# Patient Record
Sex: Male | Born: 1970 | Race: White | Hispanic: No | Marital: Single | Smoking: Never smoker
Health system: Southern US, Community
[De-identification: ages and names within clinical notes are randomized; demographics above are authoritative.]

## PROBLEM LIST (undated history)

## (undated) DIAGNOSIS — K219 Gastro-esophageal reflux disease without esophagitis: Secondary | ICD-10-CM

## (undated) DIAGNOSIS — I10 Essential (primary) hypertension: Secondary | ICD-10-CM

## (undated) DIAGNOSIS — Z87442 Personal history of urinary calculi: Secondary | ICD-10-CM

---

## 2020-04-09 ENCOUNTER — Other Ambulatory Visit: Payer: Self-pay | Admitting: Orthopedic Surgery

## 2020-04-09 DIAGNOSIS — M5416 Radiculopathy, lumbar region: Secondary | ICD-10-CM

## 2020-04-15 ENCOUNTER — Other Ambulatory Visit: Payer: Self-pay

## 2020-04-15 ENCOUNTER — Ambulatory Visit
Admission: RE | Admit: 2020-04-15 | Discharge: 2020-04-15 | Disposition: A | Discharge status: Home / Self Care | Payer: BC Managed Care – PPO | Source: Ambulatory Visit | Attending: Orthopedic Surgery | Admitting: Orthopedic Surgery

## 2020-04-15 DIAGNOSIS — M5416 Radiculopathy, lumbar region: Secondary | ICD-10-CM | POA: Insufficient documentation

## 2020-05-23 ENCOUNTER — Other Ambulatory Visit: Payer: Self-pay | Admitting: Neurosurgery

## 2020-06-03 ENCOUNTER — Other Ambulatory Visit: Admission: RE | Admit: 2020-06-03 | Payer: BC Managed Care – PPO | Source: Ambulatory Visit

## 2020-06-05 ENCOUNTER — Other Ambulatory Visit: Payer: Self-pay

## 2020-06-05 ENCOUNTER — Encounter
Admission: RE | Admit: 2020-06-05 | Discharge: 2020-06-05 | Disposition: A | Discharge status: Home / Self Care | Payer: BC Managed Care – PPO | Source: Ambulatory Visit | Attending: Neurosurgery | Admitting: Neurosurgery

## 2020-06-05 DIAGNOSIS — Z01812 Encounter for preprocedural laboratory examination: Secondary | ICD-10-CM | POA: Insufficient documentation

## 2020-06-05 HISTORY — DX: Essential (primary) hypertension: I10

## 2020-06-05 HISTORY — DX: Personal history of urinary calculi: Z87.442

## 2020-06-05 HISTORY — DX: Gastro-esophageal reflux disease without esophagitis: K21.9

## 2020-06-05 LAB — BASIC METABOLIC PANEL
Anion gap: 8 (ref 5–15)
BUN: 18 mg/dL (ref 6–20)
CO2: 24 mmol/L (ref 22–32)
Calcium: 9.6 mg/dL (ref 8.9–10.3)
Chloride: 105 mmol/L (ref 98–111)
Creatinine, Ser: 1.04 mg/dL (ref 0.61–1.24)
GFR, Estimated: >60 mL/min (ref >60)
Glucose, Bld: 99 mg/dL (ref 70–99)
Potassium: 4.2 mmol/L (ref 3.5–5.1)
Sodium: 137 mmol/L (ref 135–145)

## 2020-06-05 LAB — URINALYSIS, ROUTINE W REFLEX MICROSCOPIC
Bacteria, UA: NONE SEEN
Bilirubin Urine: NEGATIVE
Glucose, UA: NEGATIVE mg/dL
Ketones, ur: NEGATIVE mg/dL
Leukocytes,Ua: NEGATIVE
Nitrite: NEGATIVE
Protein, ur: NEGATIVE mg/dL
Specific Gravity, Urine: 1.021 (ref 1.005–1.030)
Squamous Epithelial / HPF: NONE SEEN (ref 0–5)
pH: 5 (ref 5.0–8.0)

## 2020-06-05 LAB — PROTIME-INR
INR: 1.1 (ref 0.8–1.2)
Prothrombin Time: 13.5 seconds (ref 11.4–15.2)

## 2020-06-05 LAB — CBC
HCT: 43.6 % (ref 39.0–52.0)
Hemoglobin: 15.6 g/dL (ref 13.0–17.0)
MCH: 29.4 pg (ref 26.0–34.0)
MCHC: 35.8 g/dL (ref 30.0–36.0)
MCV: 82.3 fL (ref 80.0–100.0)
Platelets: 287 10*3/uL (ref 150–400)
RBC: 5.3 MIL/uL (ref 4.22–5.81)
RDW: 12 % (ref 11.5–15.5)
WBC: 5.5 10*3/uL (ref 4.0–10.5)
nRBC: 0 % (ref 0.0–0.2)

## 2020-06-05 LAB — TYPE AND SCREEN
ABO/RH(D): O POS
Antibody Screen: NEGATIVE

## 2020-06-05 LAB — APTT: aPTT: 32 seconds (ref 24–36)

## 2020-06-05 LAB — SURGICAL PCR SCREEN
MRSA, PCR: NEGATIVE
Staphylococcus aureus: NEGATIVE

## 2020-06-05 NOTE — Patient Instructions (Addendum)
Your procedure is scheduled on: Friday June 13, 2020. Report to Day [facility] inside [facility] 2nd floor (Stop by admissions desk first before getting on elevator) To find out your arrival time please call [phone] between 1PM - 3PM on Thursday June 12, 2020.  Remember: Instructions that are not followed completely may result in serious medical risk,  up to and including death, or upon the discretion of your surgeon and anesthesiologist your  surgery may need to be rescheduled.     _X__ 1. Do not eat food after midnight the night before your procedure.                 No chewing gum or hard candies. You may drink clear liquids up to 2 hours                 before you are scheduled to arrive for your surgery- DO not drink clear                 liquids within 2 hours of the start of your surgery.                 Clear Liquids include:  water, apple juice without pulp, clear Gatorade, G2 or                  Gatorade Zero (avoid Red/Purple/Blue), Black Coffee or Tea (Do not add                 anything to coffee or tea).   __X__2.  On the morning of surgery brush your teeth with toothpaste and water, you                may rinse your mouth with mouthwash if you wish.  Do not swallow any toothpaste of mouthwash.     _X__ 3.  No Alcohol for 24 hours before or after surgery.   _X__ 4.  Do Not Smoke or use e-cigarettes For 24 Hours Prior to Your Surgery.                 Do not use any chewable tobacco products for at least 6 hours prior to                 Surgery.  _X__  5.  Do not use any recreational drugs (marijuana, cocaine, heroin, ecstasy, MDMA or other)                For at least one week prior to your surgery.  Combination of these drugs with anesthesia                May have life threatening results.  __X__6.  Notify your doctor if there is any change in your medical condition      (cold, fever, infections).     Do not wear jewelry, make-up,  hairpins, clips or nail polish. Do not wear lotions, powders, or perfumes. You may wear deodorant. Do not shave 48 hours prior to surgery. Men may shave face and neck. Do not bring valuables to the hospital.    [facility] is not responsible for any belongings or valuables.  Contacts, dentures or bridgework may not be worn into surgery. Leave your suitcase in the car. After surgery it may be brought to your room. For patients admitted to the hospital, discharge time is determined by your treatment team.   Patients discharged the day of surgery will not be allowed to drive  home.   Make arrangements for someone to be with you for the first 24 hours of your Same Day Discharge.   __X__ Take these medicines the morning of surgery with A SIP OF WATER:      1. amLODipine (NORVASC) 10 MG  2. omeprazole (PRILOSEC OTC) 20 MG   3.  4.  5.  ____ Fleet Enema (as directed)   __X__ Use CHG Soap (or wipes) as directed  ____ Use Benzoyl Peroxide Gel as instructed  ____ Use inhalers on the day of surgery  ____ Stop metformin 2 days prior to surgery    ____ Take 1/2 of usual insulin dose the night before surgery. No insulin the morning          of surgery.   __X__ Stop Anti-inflammatories such as Ibuprofen, Aleve, Advil, Motrin, naproxen, aspirin, Goody's or BC powders.    __X__ Stop supplements until after surgery.    __X__ Do not start any herbal supplements before your procedure.   If you have any questions regarding your pre-procedure instructions,  Please call Pre-admit Testing at [phone].

## 2020-06-11 ENCOUNTER — Other Ambulatory Visit
Admission: RE | Admit: 2020-06-11 | Discharge: 2020-06-11 | Disposition: A | Discharge status: Home / Self Care | Payer: BC Managed Care – PPO | Source: Ambulatory Visit | Attending: Neurosurgery | Admitting: Neurosurgery

## 2020-06-11 ENCOUNTER — Other Ambulatory Visit: Payer: Self-pay

## 2020-06-11 DIAGNOSIS — M48061 Spinal stenosis, lumbar region without neurogenic claudication: Secondary | ICD-10-CM | POA: Diagnosis not present

## 2020-06-11 DIAGNOSIS — Z20822 Contact with and (suspected) exposure to covid-19: Secondary | ICD-10-CM | POA: Insufficient documentation

## 2020-06-11 DIAGNOSIS — Z888 Allergy status to other drugs, medicaments and biological substances status: Secondary | ICD-10-CM | POA: Diagnosis not present

## 2020-06-11 DIAGNOSIS — Z01812 Encounter for preprocedural laboratory examination: Secondary | ICD-10-CM | POA: Insufficient documentation

## 2020-06-11 DIAGNOSIS — M5116 Intervertebral disc disorders with radiculopathy, lumbar region: Secondary | ICD-10-CM | POA: Diagnosis not present

## 2020-06-11 DIAGNOSIS — Z791 Long term (current) use of non-steroidal anti-inflammatories (NSAID): Secondary | ICD-10-CM | POA: Diagnosis not present

## 2020-06-11 DIAGNOSIS — Z79899 Other long term (current) drug therapy: Secondary | ICD-10-CM | POA: Diagnosis not present

## 2020-06-11 DIAGNOSIS — M4726 Other spondylosis with radiculopathy, lumbar region: Secondary | ICD-10-CM | POA: Diagnosis not present

## 2020-06-11 LAB — SARS CORONAVIRUS 2 (TAT 6-24 HRS): SARS Coronavirus 2: NEGATIVE

## 2020-06-13 ENCOUNTER — Other Ambulatory Visit: Payer: Self-pay

## 2020-06-13 ENCOUNTER — Encounter
Admission: RE | Disposition: A | Discharge status: Home / Self Care | Payer: Self-pay | Source: Home / Self Care | Attending: Neurosurgery

## 2020-06-13 ENCOUNTER — Ambulatory Visit: Payer: BC Managed Care – PPO | Admitting: Anesthesiology

## 2020-06-13 ENCOUNTER — Ambulatory Visit: Payer: BC Managed Care – PPO

## 2020-06-13 ENCOUNTER — Encounter: Payer: Self-pay | Admitting: Neurosurgery

## 2020-06-13 ENCOUNTER — Ambulatory Visit
Admission: RE | Admit: 2020-06-13 | Discharge: 2020-06-13 | Disposition: A | Discharge status: Home / Self Care | Payer: BC Managed Care – PPO | Attending: Neurosurgery | Admitting: Neurosurgery

## 2020-06-13 DIAGNOSIS — M5116 Intervertebral disc disorders with radiculopathy, lumbar region: Secondary | ICD-10-CM | POA: Diagnosis not present

## 2020-06-13 DIAGNOSIS — Z20822 Contact with and (suspected) exposure to covid-19: Secondary | ICD-10-CM | POA: Insufficient documentation

## 2020-06-13 DIAGNOSIS — Z888 Allergy status to other drugs, medicaments and biological substances status: Secondary | ICD-10-CM | POA: Insufficient documentation

## 2020-06-13 DIAGNOSIS — Z79899 Other long term (current) drug therapy: Secondary | ICD-10-CM | POA: Insufficient documentation

## 2020-06-13 DIAGNOSIS — M4726 Other spondylosis with radiculopathy, lumbar region: Secondary | ICD-10-CM | POA: Insufficient documentation

## 2020-06-13 DIAGNOSIS — M48061 Spinal stenosis, lumbar region without neurogenic claudication: Secondary | ICD-10-CM | POA: Insufficient documentation

## 2020-06-13 DIAGNOSIS — Z791 Long term (current) use of non-steroidal anti-inflammatories (NSAID): Secondary | ICD-10-CM | POA: Insufficient documentation

## 2020-06-13 DIAGNOSIS — Z419 Encounter for procedure for purposes other than remedying health state, unspecified: Secondary | ICD-10-CM

## 2020-06-13 HISTORY — PX: LUMBAR LAMINECTOMY/DECOMPRESSION MICRODISCECTOMY: SHX5026

## 2020-06-13 LAB — ABO/RH: ABO/RH(D): O POS

## 2020-06-13 SURGERY — LUMBAR LAMINECTOMY/DECOMPRESSION MICRODISCECTOMY 1 LEVEL
Anesthesia: General | Laterality: Left

## 2020-06-13 MED ORDER — FENTANYL CITRATE (PF) 100 MCG/2ML IJ SOLN
INTRAMUSCULAR | Status: AC
  Filled 2020-06-13: qty 2

## 2020-06-13 MED ORDER — PHENYLEPHRINE HCL (PRESSORS) 10 MG/ML IV SOLN
INTRAVENOUS | Status: DC | PRN
  Administered 2020-06-13 (×2): 50 ug via INTRAVENOUS
  Administered 2020-06-13 (×3): 100 ug via INTRAVENOUS

## 2020-06-13 MED ORDER — LIDOCAINE HCL (PF) 2 % IJ SOLN
INTRAMUSCULAR | Status: AC
  Filled 2020-06-13: qty 5

## 2020-06-13 MED ORDER — ACETAMINOPHEN 10 MG/ML IV SOLN
INTRAVENOUS | Status: AC
  Filled 2020-06-13: qty 100

## 2020-06-13 MED ORDER — DEXAMETHASONE SODIUM PHOSPHATE 10 MG/ML IJ SOLN
INTRAMUSCULAR | Status: DC | PRN
  Administered 2020-06-13: 10 mg via INTRAVENOUS

## 2020-06-13 MED ORDER — ACETAMINOPHEN 10 MG/ML IV SOLN
INTRAVENOUS | Status: DC | PRN
  Administered 2020-06-13: 1000 mg via INTRAVENOUS

## 2020-06-13 MED ORDER — LACTATED RINGERS IV SOLN: INTRAVENOUS | Status: DC

## 2020-06-13 MED ORDER — BUPIVACAINE HCL (PF) 0.5 % IJ SOLN
INTRAMUSCULAR | Status: AC
  Filled 2020-06-13: qty 30

## 2020-06-13 MED ORDER — FENTANYL CITRATE (PF) 100 MCG/2ML IJ SOLN: 25.0000 ug | INTRAMUSCULAR | Status: DC | PRN

## 2020-06-13 MED ORDER — SUCCINYLCHOLINE CHLORIDE 200 MG/10ML IV SOSY
PREFILLED_SYRINGE | INTRAVENOUS | Status: AC
  Filled 2020-06-13: qty 10

## 2020-06-13 MED ORDER — DEXMEDETOMIDINE (PRECEDEX) IN NS 20 MCG/5ML (4 MCG/ML) IV SYRINGE
PREFILLED_SYRINGE | INTRAVENOUS | Status: AC
  Filled 2020-06-13: qty 5

## 2020-06-13 MED ORDER — ORAL CARE MOUTH RINSE: 15.0000 mL | Freq: Once | OROMUCOSAL | Status: AC

## 2020-06-13 MED ORDER — PROPOFOL 10 MG/ML IV BOLUS
INTRAVENOUS | Status: DC | PRN
  Administered 2020-06-13: 200 mg via INTRAVENOUS

## 2020-06-13 MED ORDER — PROPOFOL 1000 MG/100ML IV EMUL
INTRAVENOUS | Status: AC
  Filled 2020-06-13: qty 100

## 2020-06-13 MED ORDER — EPHEDRINE 5 MG/ML INJ
INTRAVENOUS | Status: AC
  Filled 2020-06-13: qty 10

## 2020-06-13 MED ORDER — CHLORHEXIDINE GLUCONATE 0.12 % MT SOLN
15.0000 mL | Freq: Once | OROMUCOSAL | Status: AC
  Administered 2020-06-13: 15 mL via OROMUCOSAL

## 2020-06-13 MED ORDER — ONDANSETRON HCL 4 MG/2ML IJ SOLN: 4.0000 mg | Freq: Once | INTRAMUSCULAR | Status: DC | PRN

## 2020-06-13 MED ORDER — METHOCARBAMOL 500 MG PO TABS: 500.0000 mg | ORAL_TABLET | Freq: Four times a day (QID) | ORAL | 0 refills | Status: AC | PRN

## 2020-06-13 MED ORDER — PROPOFOL 10 MG/ML IV BOLUS
INTRAVENOUS | Status: AC
  Filled 2020-06-13: qty 20

## 2020-06-13 MED ORDER — OXYCODONE HCL 5 MG PO TABS: 5.0000 mg | ORAL_TABLET | ORAL | 0 refills | Status: AC | PRN

## 2020-06-13 MED ORDER — BUPIVACAINE HCL 0.5 % IJ SOLN
INTRAMUSCULAR | Status: DC | PRN
  Administered 2020-06-13: 20 mL

## 2020-06-13 MED ORDER — DEXMEDETOMIDINE (PRECEDEX) IN NS 20 MCG/5ML (4 MCG/ML) IV SYRINGE
PREFILLED_SYRINGE | INTRAVENOUS | Status: DC | PRN
  Administered 2020-06-13: 20 ug via INTRAVENOUS

## 2020-06-13 MED ORDER — MIDAZOLAM HCL 2 MG/2ML IJ SOLN
INTRAMUSCULAR | Status: DC | PRN
  Administered 2020-06-13: 2 mg via INTRAVENOUS

## 2020-06-13 MED ORDER — EPHEDRINE SULFATE 50 MG/ML IJ SOLN
INTRAMUSCULAR | Status: DC | PRN
  Administered 2020-06-13: 15 mg via INTRAVENOUS

## 2020-06-13 MED ORDER — CHLORHEXIDINE GLUCONATE 0.12 % MT SOLN
OROMUCOSAL | Status: AC
  Filled 2020-06-13: qty 15

## 2020-06-13 MED ORDER — CEFAZOLIN SODIUM-DEXTROSE 2-4 GM/100ML-% IV SOLN
INTRAVENOUS | Status: AC
  Filled 2020-06-13: qty 100

## 2020-06-13 MED ORDER — BUPIVACAINE LIPOSOME 1.3 % IJ SUSP
INTRAMUSCULAR | Status: AC
  Filled 2020-06-13: qty 20

## 2020-06-13 MED ORDER — BUPIVACAINE-EPINEPHRINE (PF) 0.5% -1:200000 IJ SOLN
INTRAMUSCULAR | Status: AC
  Filled 2020-06-13: qty 30

## 2020-06-13 MED ORDER — SODIUM CHLORIDE FLUSH 0.9 % IV SOLN
INTRAVENOUS | Status: AC
  Filled 2020-06-13: qty 20

## 2020-06-13 MED ORDER — PHENYLEPHRINE HCL (PRESSORS) 10 MG/ML IV SOLN
INTRAVENOUS | Status: AC
  Filled 2020-06-13: qty 1

## 2020-06-13 MED ORDER — METHYLPREDNISOLONE ACETATE 40 MG/ML IJ SUSP
INTRAMUSCULAR | Status: AC
  Filled 2020-06-13: qty 1

## 2020-06-13 MED ORDER — LIDOCAINE HCL (CARDIAC) PF 100 MG/5ML IV SOSY
PREFILLED_SYRINGE | INTRAVENOUS | Status: DC | PRN
  Administered 2020-06-13: 100 mg via INTRAVENOUS

## 2020-06-13 MED ORDER — THROMBIN (RECOMBINANT) 5000 UNITS EX SOLR
CUTANEOUS | Status: AC
  Filled 2020-06-13: qty 5000

## 2020-06-13 MED ORDER — DEXAMETHASONE SODIUM PHOSPHATE 10 MG/ML IJ SOLN
INTRAMUSCULAR | Status: AC
  Filled 2020-06-13: qty 1

## 2020-06-13 MED ORDER — SODIUM CHLORIDE 0.9 % IV SOLN
INTRAVENOUS | Status: DC | PRN
  Administered 2020-06-13: 40 mL

## 2020-06-13 MED ORDER — BUPIVACAINE-EPINEPHRINE (PF) 0.5% -1:200000 IJ SOLN
INTRAMUSCULAR | Status: DC | PRN
  Administered 2020-06-13: 6 mL

## 2020-06-13 MED ORDER — CEFAZOLIN SODIUM-DEXTROSE 2-4 GM/100ML-% IV SOLN
2.0000 g | Freq: Once | INTRAVENOUS | Status: AC
  Administered 2020-06-13: 2 g via INTRAVENOUS

## 2020-06-13 MED ORDER — PROPOFOL 500 MG/50ML IV EMUL
INTRAVENOUS | Status: DC | PRN
  Administered 2020-06-13: 100 ug/kg/min via INTRAVENOUS

## 2020-06-13 MED ORDER — ONDANSETRON HCL 4 MG/2ML IJ SOLN
INTRAMUSCULAR | Status: AC
  Filled 2020-06-13: qty 2

## 2020-06-13 MED ORDER — FLUMAZENIL 0.5 MG/5ML IV SOLN
INTRAVENOUS | Status: AC
  Filled 2020-06-13: qty 5

## 2020-06-13 MED ORDER — SUCCINYLCHOLINE CHLORIDE 20 MG/ML IJ SOLN
INTRAMUSCULAR | Status: DC | PRN
  Administered 2020-06-13: 140 mg via INTRAVENOUS

## 2020-06-13 MED ORDER — ROCURONIUM BROMIDE 10 MG/ML (PF) SYRINGE
PREFILLED_SYRINGE | INTRAVENOUS | Status: AC
  Filled 2020-06-13: qty 10

## 2020-06-13 MED ORDER — ONDANSETRON HCL 4 MG/2ML IJ SOLN
INTRAMUSCULAR | Status: DC | PRN
  Administered 2020-06-13: 4 mg via INTRAVENOUS

## 2020-06-13 MED ORDER — METHYLPREDNISOLONE ACETATE 40 MG/ML IJ SUSP
INTRAMUSCULAR | Status: DC | PRN
  Administered 2020-06-13: 40 mg

## 2020-06-13 MED ORDER — FENTANYL CITRATE (PF) 100 MCG/2ML IJ SOLN
INTRAMUSCULAR | Status: DC | PRN
  Administered 2020-06-13: 50 ug via INTRAVENOUS
  Administered 2020-06-13: 100 ug via INTRAVENOUS

## 2020-06-13 MED ORDER — MIDAZOLAM HCL 2 MG/2ML IJ SOLN
INTRAMUSCULAR | Status: AC
  Filled 2020-06-13: qty 2

## 2020-06-13 MED ORDER — FLUMAZENIL 0.5 MG/5ML IV SOLN
INTRAVENOUS | Status: DC | PRN
  Administered 2020-06-13: .2 mg via INTRAVENOUS

## 2020-06-13 MED ORDER — THROMBIN 5000 UNITS EX SOLR
CUTANEOUS | Status: DC | PRN
  Administered 2020-06-13: 5000 [IU] via TOPICAL

## 2020-06-13 SURGICAL SUPPLY — 59 items
ADH SKN CLS APL DERMABOND .7 (GAUZE/BANDAGES/DRESSINGS) ×1
AGENT HMST MTR 8 SURGIFLO (HEMOSTASIS) ×1
APL PRP STRL LF DISP 70% ISPRP (MISCELLANEOUS) ×2
BUR NEURO DRILL SOFT 3.0X3.8M (BURR) ×2 IMPLANT
CANISTER SUCT 1200ML W/VALVE (MISCELLANEOUS) ×2 IMPLANT
CHLORAPREP W/TINT 26 (MISCELLANEOUS) ×4 IMPLANT
CNTNR SPEC 2.5X3XGRAD LEK (MISCELLANEOUS) ×1
CONT SPEC 4OZ STER OR WHT (MISCELLANEOUS) ×1
CONT SPEC 4OZ STRL OR WHT (MISCELLANEOUS) ×1
CONTAINER SPEC 2.5X3XGRAD LEK (MISCELLANEOUS) ×1 IMPLANT
COUNTER NEEDLE 20/40 LG (NEEDLE) ×2 IMPLANT
COVER WAND RF STERILE (DRAPES) ×2 IMPLANT
CUP MEDICINE 2OZ PLAST GRAD ST (MISCELLANEOUS) ×4 IMPLANT
DERMABOND ADVANCED (GAUZE/BANDAGES/DRESSINGS) ×1
DERMABOND ADVANCED .7 DNX12 (GAUZE/BANDAGES/DRESSINGS) ×1 IMPLANT
DRAPE C ARM PK CFD 31 SPINE (DRAPES) ×3 IMPLANT
DRAPE LAPAROTOMY 100X77 ABD (DRAPES) ×2 IMPLANT
DRAPE MICROSCOPE SPINE 48X150 (DRAPES) ×2 IMPLANT
DRAPE SURG 17X11 SM STRL (DRAPES) ×8 IMPLANT
DRSG OPSITE POSTOP 4X6 (GAUZE/BANDAGES/DRESSINGS) ×1 IMPLANT
ELECT CAUTERY BLADE TIP 2.5 (TIP) ×2
ELECT EZSTD 165MM 6.5IN (MISCELLANEOUS)
ELECT REM PT RETURN 9FT ADLT (ELECTROSURGICAL) ×2
ELECTRODE CAUTERY BLDE TIP 2.5 (TIP) ×1 IMPLANT
ELECTRODE EZSTD 165MM 6.5IN (MISCELLANEOUS) IMPLANT
ELECTRODE REM PT RTRN 9FT ADLT (ELECTROSURGICAL) ×1 IMPLANT
GLOVE SURG SYN 7.0 (GLOVE) ×4 IMPLANT
GLOVE SURG SYN 7.0 PF PI (GLOVE) ×2 IMPLANT
GLOVE SURG SYN 8.5  E (GLOVE) ×3
GLOVE SURG SYN 8.5 E (GLOVE) ×3 IMPLANT
GLOVE SURG SYN 8.5 PF PI (GLOVE) ×3 IMPLANT
GLOVE SURG UNDER POLY LF SZ7 (GLOVE) ×2 IMPLANT
GOWN SRG XL LVL 3 NONREINFORCE (GOWNS) ×1 IMPLANT
GOWN STRL NON-REIN TWL XL LVL3 (GOWNS) ×2
GOWN STRL REUS W/ TWL XL LVL3 (GOWN DISPOSABLE) ×1 IMPLANT
GOWN STRL REUS W/TWL XL LVL3 (GOWN DISPOSABLE) ×2
GRADUATE 1200CC STRL 31836 (MISCELLANEOUS) ×2 IMPLANT
KIT SPINAL PRONEVIEW (KITS) ×2 IMPLANT
KNIFE BAYONET SHORT DISCETOMY (MISCELLANEOUS) IMPLANT
MANIFOLD NEPTUNE II (INSTRUMENTS) ×2 IMPLANT
MARKER SKIN DUAL TIP RULER LAB (MISCELLANEOUS) ×2 IMPLANT
NDL SAFETY ECLIPSE 18X1.5 (NEEDLE) ×1 IMPLANT
NEEDLE HYPO 18GX1.5 SHARP (NEEDLE) ×2
NEEDLE HYPO 22GX1.5 SAFETY (NEEDLE) ×2 IMPLANT
NS IRRIG 1000ML POUR BTL (IV SOLUTION) ×2 IMPLANT
PACK LAMINECTOMY NEURO (CUSTOM PROCEDURE TRAY) ×2 IMPLANT
PAD ARMBOARD 7.5X6 YLW CONV (MISCELLANEOUS) ×2 IMPLANT
SPOGE SURGIFLO 8M (HEMOSTASIS) ×1
SPONGE SURGIFLO 8M (HEMOSTASIS) ×1 IMPLANT
SUT DVC VLOC 3-0 CL 6 P-12 (SUTURE) ×2 IMPLANT
SUT VIC AB 0 CT1 27 (SUTURE) ×2
SUT VIC AB 0 CT1 27XCR 8 STRN (SUTURE) ×1 IMPLANT
SUT VIC AB 2-0 CT1 18 (SUTURE) ×2 IMPLANT
SYR 10ML LL (SYRINGE) ×2 IMPLANT
SYR 20ML LL LF (SYRINGE) ×2 IMPLANT
SYR 30ML LL (SYRINGE) ×4 IMPLANT
SYR 3ML LL SCALE MARK (SYRINGE) ×2 IMPLANT
TOWEL OR 17X26 4PK STRL BLUE (TOWEL DISPOSABLE) ×6 IMPLANT
TUBING CONNECTING 10 (TUBING) ×2 IMPLANT

## 2020-06-13 NOTE — Op Note (Signed)
Indications: Mr. Mccleery is a 50 yo male with lumbar radiculopathy.  He developed weakness prompting surgical consideration.  Findings: large disc herniation L4-5  Preoperative Diagnosis: Lumbar radiculopathy Postoperative Diagnosis: same   EBL: 10 ml IVF: 600 ml Drains: none Disposition: Extubated and Stable to PACU Complications: none  No foley catheter was placed.   Preoperative Note:   Risks of surgery discussed include: infection, bleeding, stroke, coma, death, paralysis, CSF leak, nerve/spinal cord injury, numbness, tingling, weakness, complex regional pain syndrome, recurrent stenosis and/or disc herniation, vascular injury, development of instability, neck/back pain, need for further surgery, persistent symptoms, development of deformity, and the risks of anesthesia. The patient understood these risks and agreed to proceed.  Operative Note:   1) Left L4/5 microdiscectomy  The patient was then brought from the preoperative center with intravenous access established.  The patient underwent general anesthesia and endotracheal tube intubation, and was then rotated on the Ord rail top where all pressure points were appropriately padded.  The skin was then thoroughly cleansed.  Perioperative antibiotic prophylaxis was administered.  Sterile prep and drapes were then applied and a timeout was then observed.  C-arm was brought into the field under sterile conditions, and the L4-5 disc space identified and marked with an incision on the left 1cm lateral to midline.  Once this was complete a 2 cm incision was opened with the use of a #10 blade knife.  The Metrx tubes were sequentially advanced under lateral fluoroscopy until a 18 x 50 mm Metrx tube was placed over the facet and lamina and secured to the bed.    The microscope was then sterilely brought into the field and muscle creep was hemostased with a bipolar and resected with a pituitary rongeur.  A Bovie extender was then used to  expose the spinous process and lamina.  Careful attention was placed to not violate the facet capsule. A 3 mm matchstick drill bit was then used to make a hemi-laminotomy trough until the ligamentum flavum was exposed.  This was extended to the base of the spinous process.  Once this was complete and the underlying ligamentum flavum was visualized this was dissected with an up angle curette and resected with a #2 and #3 mm biting Kerrison.  The laminotomy opening was also expanded in similar fashion and hemostasis was obtained with Surgifoam and a patty as well as bone wax.  The rostral aspect of the caudal level of the lamina was also resected with a #2 biting Kerrison effort to further enhance exposure.  Once the underlying dura was visualized a Penfield 4 was then used to dissect and expose the traversing nerve root.  Once this was identified a nerve root retractor suction was used to mobilize this medially.  The venous plexus was hemostased with Surgifoam and light bipolar use.  A small Penfield 4 was then used to make a small annulotomy within the disc space and disc space contents were noted to come through the annulus.    The disc herniation was identified and dissected free using a balltip probe. The pituitary rongeur was used to remove the extruded disc fragments. Once the thecal sac and nerve root were noted to be relaxed and under less tension the ball-tipped feeler was passed along the foramen distally to to ensure no residual compression was noted.    Depo-Medrol was placed along the nerve root.  The area was irrigated. The tube system was then removed under microscopic visualization and hemostasis was obtained with a  bipolar.    The fascial layer was reapproximated with the use of a 0- Vicryl suture.  Subcutaneous tissue layer was reapproximated using 2-0 Vicryl suture.  3-0 monocryl was used on the skin. The skin was then cleansed and Dermabond was used to close the skin opening.  Patient was  then rotated back to the preoperative bed awakened from anesthesia and taken to recovery all counts are correct in this case.   I performed the entire procedure with the assistance of Anabel Halon PA as an Designer, television/film set.  Venetia Night MD

## 2020-06-13 NOTE — Anesthesia Preprocedure Evaluation (Addendum)
Anesthesia Evaluation  Patient identified by MRN, date of birth, ID band Patient awake    Reviewed: Allergy & Precautions, NPO status , Patient's Chart, lab work & pertinent test results  Airway Mallampati: II  TM Distance: >3 FB     Dental  (+) Chipped, Teeth Intact   Pulmonary neg pulmonary ROS,    Pulmonary exam normal        Cardiovascular hypertension, Normal cardiovascular exam     Neuro/Psych Left pupil larger than right  Neuromuscular disease negative psych ROS   GI/Hepatic Neg liver ROS, GERD  ,  Endo/Other  negative endocrine ROS  Renal/GU negative Renal ROS  negative genitourinary   Musculoskeletal   Abdominal Normal abdominal exam  (+)   Peds negative pediatric ROS (+)  Hematology negative hematology ROS (+)   Anesthesia Other Findings Past Medical History: No date: GERD (gastroesophageal reflux disease) No date: History of kidney stones No date: Hypertension  Left pupil larger than right pupil... Patient had a head injury in distant past and since then his left pupil has been significantly larger than the right.  Reproductive/Obstetrics                           Anesthesia Physical Anesthesia Plan  ASA: II  Anesthesia Plan: General   Post-op Pain Management:    Induction: Intravenous  PONV Risk Score and Plan:   Airway Management Planned: Oral ETT  Additional Equipment:   Intra-op Plan:   Post-operative Plan: Extubation in OR  Informed Consent: I have reviewed the patients History and Physical, chart, labs and discussed the procedure including the risks, benefits and alternatives for the proposed anesthesia with the patient or authorized representative who has indicated his/her understanding and acceptance.     Dental advisory given  Plan Discussed with: CRNA and Surgeon  Anesthesia Plan Comments:         Anesthesia Quick Evaluation

## 2020-06-13 NOTE — H&P (Signed)
History of Present Illness: 06/13/2020 [name] is a 50 yo male who presented with left leg pain.  He tried and failed conservative therapy without improvement.   05/07/2020 Mitchell Black is here today with a chief complaint of left sided low back pain with radiation into the left buttock and posterior leg stopping at the ankle.  He has had pain for 4 to 5 months and has had worsening pain over time. He did recently have an injection which helped, but he still has pain. Pain is made worse by standing, walking, laying down, and getting up. Nothing really helps. He thinks that his left leg may be weak.  He is still working, but struggling due to his pain.  Bowel/Bladder Dysfunction: none  Conservative measures:  Physical therapy: no formal therapy, but participated in home exercises that made the pain worse Multimodal medical therapy including regular antiinflammatories: prednisone, ibuprofen Injections: has had epidural steroid injections 04/22/2020: Left L5-S1 ESI  Past Surgery: none  Mitchell Black has no symptoms of cervical myelopathy.  The symptoms are causing a significant impact on the patient's life.   Review of Systems:  A 10 point review of systems is negative, except for the pertinent positives and negatives detailed in the HPI.  Past Medical History: Past Medical History:  Diagnosis Date  . Chicken pox  . Hypertension   Past Surgical History: Past Surgical History:  Procedure Laterality Date  . Left pinky finger surgey Left  . Wisdom teeth   No Known Allergies  Current Meds  Medication Sig  . allopurinol (ZYLOPRIM) 100 MG tablet Take 100 mg by mouth daily.  Marland Kitchen amLODipine (NORVASC) 10 MG tablet Take 10 mg by mouth daily.  Marland Kitchen ibuprofen (ADVIL) 800 MG tablet Take 800 mg by mouth 3 (three) times daily as needed for pain.  Marland Kitchen lisinopril (ZESTRIL) 20 MG tablet Take 20 mg by mouth daily.  Marland Kitchen omeprazole (PRILOSEC OTC) 20 MG tablet Take 20 mg by mouth daily.     No  facility-administered encounter medications on file as of 05/09/2020.   Social History: Social History   Tobacco Use  . Smoking status: Never Smoker  . Smokeless tobacco: Never Used  Substance Use Topics  . Alcohol use: Yes  . Drug use: Never   Family Medical History: Family History  Problem Relation Age of Onset  . No Known Problems Mother  . High blood pressure (Hypertension) Father  . No Known Problems Sister   Physical Examination:  Vitals:   06/13/20 0617  BP: (!) 143/72  Pulse: 87  Resp: 16  Temp: (!) 97.5 F (36.4 C)  SpO2: 98%    Heart sounds normal no MRG. Chest Clear to Auscultation Bilaterally.   General: Patient is well developed, well nourished, calm, collected, and in no apparent distress. Attention to examination is appropriate.  Psychiatric: Patient is non-anxious.  Head: Pupils equal, round, and reactive to light.  ENT: Oral mucosa appears well hydrated.  Neck: Supple. Full range of motion.  Respiratory: Patient is breathing without any difficulty.  Extremities: No edema.  Vascular: Palpable dorsal pedal pulses.  Skin: On exposed skin, there are no abnormal skin lesions.  NEUROLOGICAL:   Awake, alert, oriented to person, place, and time. Speech is clear and fluent. Fund of knowledge is appropriate.   Cranial Nerves: Pupils equal round and reactive to light. Facial tone is symmetric. Facial sensation is symmetric. Shoulder shrug is symmetric. Tongue protrusion is midline. There is no pronator drift.  ROM of spine:  diminished due to pain  Strength: Side Biceps Triceps Deltoid Interossei Grip Wrist Ext. Wrist Flex.  R 5 5 5 5 5 5 5   L 5 5 5 5 5 5 5    Side Iliopsoas Quads Hamstring PF DF EHL  R 5 5 5 5 5 5   L 5 5 5 5 5 4    Reflexes are 2+ and symmetric at the biceps, triceps, brachioradialis, patella. L achilles 1+, R achilles 2+. Hoffman's is absent. Bilateral upper and lower extremity sensation is intact to light touch except L L5  distribution is numb.  Gait is antalgic. No evidence of dysmetria noted.  Medical Decision Making  Imaging: MRI L spine 04/15/20 IMPRESSION:  1. Bone marrow edema within the left hemisacrum seen at the edge of  the field of view. Findings may reflect an acute to subacute sacral  fracture. Dedicated MRI of the sacrum is recommended for further  evaluation.  2. Lumbar spondylosis with mild canal stenosis at L3-L4 and L4-L5.  3. Left paracentral disc protrusion at L4-L5 with resulting mass  effect upon the descending left L5 nerve root within the left  subarticular recess. Moderate left and mild-to-moderate right  foraminal stenosis at this level.  4. Mild bilateral foraminal stenosis at L3-L4.   These results will be called to the ordering clinician or  representative by the Radiologist Assistant, and communication  documented in the PACS or .   Electronically Signed  By: D.O.  On: 04/16/2020 13:05  I have personally reviewed the images and agree with the above interpretation.  Assessment and Plan: Mr. Kilker is a pleasant 50 y.o. male with left-sided L5 radiculopathy. He has been dealing with this for nearly 6 months. He now has weakness in the left L5 distribution. He has tried home exercises and injections without improvement.   We will move forward with surgical intervention with L L4-5 microdiscectomy.  Duanne Guess MD, [facility] Department of Neurosurgery

## 2020-06-13 NOTE — Anesthesia Postprocedure Evaluation (Signed)
Anesthesia Post Note  Patient: Mitchell Black  Procedure(s) Performed: LEFT L4-5 MICRODISCECTOMY (Left )  Patient location during evaluation: PACU Anesthesia Type: General Level of consciousness: awake and alert and oriented Pain management: pain level controlled Vital Signs Assessment: post-procedure vital signs reviewed and stable Respiratory status: spontaneous breathing Cardiovascular status: blood pressure returned to baseline Anesthetic complications: no   No complications documented.   Last Vitals:  Vitals:   06/13/20 1007 06/13/20 1052  BP: 117/74 118/84  Pulse: 73 80  Resp: 18   Temp: (!) 36.3 C   SpO2: 98% 94%    Last Pain:  Vitals:   06/13/20 1052  TempSrc:   PainSc: 0-No pain                 Evelina Lore

## 2020-06-13 NOTE — Discharge Summary (Signed)
Physician Discharge Summary  Patient ID: LYRIQ JARCHOW MRN: [id number] DOB/AGE: Jan 16, 1971 50 y.o.  Admit date: 06/13/2020 Discharge date: 06/13/2020  Admission Diagnoses: Lumbar radiculopathy and leg weakness  Discharge Diagnoses:  Active Problems:   * No active hospital problems. *   Discharged Condition: good  Hospital Course: Mr. Olmo was admitted for surgical intervention.  He tolerated the procedure well, and was admitted to the PACU.  After ambulating and meeting discharge criteria, he was discharged.  Consults: None  Significant Diagnostic Studies: radiology: X-Ray: Localization at L4-5  Treatments: surgery: L L4-5 microdiscectomy  Discharge Exam: Blood pressure (!) 143/72, pulse 87, temperature (!) 97.5 F (36.4 C), temperature source Temporal, resp. rate 16, SpO2 98 %. General appearance: alert and cooperative  CNI MAEW grossly intact  Disposition: Discharge disposition: 01-Home or Self Care       Discharge Instructions    Discharge patient   Complete by: As directed    Discharge disposition: 01-Home or Self Care   Discharge patient date: 06/13/2020     Allergies as of 06/13/2020   No Known Allergies     Medication List    TAKE these medications   allopurinol 100 MG tablet Commonly known as: ZYLOPRIM Take 100 mg by mouth daily.   amLODipine 10 MG tablet Commonly known as: NORVASC Take 10 mg by mouth daily.   ibuprofen 800 MG tablet Commonly known as: ADVIL Take 800 mg by mouth 3 (three) times daily as needed for pain.   lisinopril 20 MG tablet Commonly known as: ZESTRIL Take 20 mg by mouth daily.   methocarbamol 500 MG tablet Commonly known as: Robaxin Take 1 tablet (500 mg total) by mouth every 6 (six) hours as needed for muscle spasms.   omeprazole 20 MG tablet Commonly known as: PRILOSEC OTC Take 20 mg by mouth daily.   oxyCODONE 5 MG immediate release tablet Commonly known as: Roxicodone Take 1 tablet (5 mg total) by mouth every  4 (four) hours as needed for up to 7 days for moderate pain.       Follow-up Information    Venetia Night, MD.   Specialty: Neurosurgery Why: already scheduled Contact information: [address] [place] [address] [phone]               Signed: Venetia Night 06/13/2020, 8:59 AM

## 2020-06-13 NOTE — Discharge Instructions (Addendum)
Your surgeon has performed an operation on your lumbar spine (low back) to relieve pressure on one or more nerves. Many times, patients feel better immediately after surgery and can "overdo it." Even if you feel well, it is important that you follow these activity guidelines. If you do not let your back heal properly from the surgery, you can increase the chance of a disc herniation and/or return of your symptoms. The following are instructions to help in your recovery once you have been discharged from the hospital.  * Do not take anti-inflammatory medications for 1 day after surgery (naproxen [Aleve], ibuprofen [Advil, Motrin], celecoxib [Celebrex], etc.) OK to take after that time.  Activity    No bending, lifting, or twisting ("BLT"). Avoid lifting objects heavier than 10 pounds (gallon milk jug).  Where possible, avoid household activities that involve lifting, bending, pushing, or pulling such as laundry, vacuuming, grocery shopping, and childcare. Try to arrange for help from friends and family for these activities while your back heals.  Increase physical activity slowly as tolerated.  Taking short walks is encouraged, but avoid strenuous exercise. Do not jog, run, bicycle, lift weights, or participate in any other exercises unless specifically allowed by your doctor. Avoid prolonged sitting, including car rides.  Talk to your doctor before resuming sexual activity.  You should not drive until cleared by your doctor.  Until released by your doctor, you should not return to work or school.  You should rest at home and let your body heal.   You may shower one day after your surgery.  After showering, lightly dab your incision dry. Do not take a tub bath or go swimming for 3 weeks, or until approved by your doctor at your follow-up appointment.  If you smoke, we strongly recommend that you quit.  Smoking has been proven to interfere with normal healing in your back and will dramatically  reduce the success rate of your surgery. Please contact [facility] (800-QUIT-NOW) and use the resources at www.[facility].com for assistance in stopping smoking.  Surgical Incision   If you have a dressing on your incision, you may remove it three days after your surgery. Keep your incision area clean and dry.  If you have staples or stitches on your incision, you should have a follow up scheduled for removal. If you do not have staples or stitches, you will have steri-strips (small pieces of surgical tape) or Dermabond glue. The steri-strips/glue should begin to peel away within about a week (it is fine if the steri-strips fall off before then). If the strips are still in place one week after your surgery, you may gently remove them.  Diet            You may return to your usual diet. Be sure to stay hydrated.  When to Contact us  Although your surgery and recovery will likely be uneventful, you may have some residual numbness, aches, and pains in your back and/or legs. This is normal and should improve in the next few weeks.  However, should you experience any of the following, contact us immediately: . New numbness or weakness . Pain that is progressively getting worse, and is not relieved by your pain medications or rest . Bleeding, redness, swelling, pain, or drainage from surgical incision . Chills or flu-like symptoms . Fever greater than 101.0 F (38.3 C) . Problems with bowel or bladder functions . Difficulty breathing or shortness of breath . Warmth, tenderness, or swelling in your calf  Contact  Information . During office hours (Monday-Friday 9 am to 5 pm), please call your physician at [phone] . After hours and weekends, please call [phone] and speak with the answering service, who will contact the doctor on call.  If that fails, call the [facility] Operator at [phone] and ask for the Neurosurgery Resident On Call  . For a life-threatening emergency, call  [phone]  AMBULATORY SURGERY  DISCHARGE INSTRUCTIONS   1) The drugs that you were given will stay in your system until tomorrow so for the next 24 hours you should not:  A) Drive an automobile B) Make any legal decisions C) Drink any alcoholic beverage   2) You may resume regular meals tomorrow.  Today it is better to start with liquids and gradually work up to solid foods.  You may eat anything you prefer, but it is better to start with liquids, then soup and crackers, and gradually work up to solid foods.   3) Please notify your doctor immediately if you have any unusual bleeding, trouble breathing, redness and pain at the surgery site, drainage, fever, or pain not relieved by medication.    4) Additional Instructions:        Please contact your physician with any problems or Same Day Surgery at [phone], Monday through Friday 6 am to 4 pm, or [facility] number at [phone].  Bupivacaine Liposomal Suspension for Injection What is this medicine? BUPIVACAINE LIPOSOMAL (bue PIV a kane LIP oh som al) is an anesthetic. It causes loss of feeling in the skin or other tissues. It is used to prevent and to treat pain from some procedures. This medicine may be used for other purposes; ask your health care provider or pharmacist if you have questions. COMMON BRAND NAME(S): EXPAREL What should I tell my health care provider before I take this medicine? They need to know if you have any of these conditions:  G6PD deficiency  heart disease  kidney disease  liver disease  low blood pressure  lung or breathing disease, like asthma  an unusual or allergic reaction to bupivacaine, other medicines, foods, dyes, or preservatives  pregnant or trying to get pregnant  breast-feeding How should I use this medicine? This medicine is injected into the affected area. It is given by a health care provider in a hospital or clinic setting. Talk to your health care  provider about the use of this medicine in children. While it may be given to children as young as 6 years for selected conditions, precautions do apply. Overdosage: If you think you have taken too much of this medicine contact a poison control center or emergency room at once. NOTE: This medicine is only for you. Do not share this medicine with others. What if I miss a dose? This does not apply. What may interact with this medicine? This medicine may interact with the following medications:  acetaminophen  certain antibiotics like dapsone, nitrofurantoin, aminosalicylic acid, sulfonamides  certain medicines for seizures like phenobarbital, phenytoin, valproic acid  chloroquine  cyclophosphamide  flutamide  hydroxyurea  ifosfamide  metoclopramide  nitric oxide  nitroglycerin  nitroprusside  nitrous oxide  other local anesthetics like lidocaine, pramoxine, tetracaine  primaquine  quinine  rasburicase  sulfasalazine This list may not describe all possible interactions. Give your health care provider a list of all the medicines, herbs, non-prescription drugs, or dietary supplements you use. Also tell them if you smoke, drink alcohol, or use illegal drugs. Some items may interact with your  medicine. What should I watch for while using this medicine? Your condition will be monitored carefully while you are receiving this medicine. Be careful to avoid injury while the area is numb, and you are not aware of pain. What side effects may I notice from receiving this medicine? Side effects that you should report to your doctor or health care professional as soon as possible:  allergic reactions like skin rash, itching or hives, swelling of the face, lips, or tongue  seizures  signs and symptoms of a dangerous change in heartbeat or heart rhythm like chest pain; dizziness; fast, irregular heartbeat; palpitations; feeling faint or lightheaded; falls; breathing  problems  signs and symptoms of methemoglobinemia such as pale, gray, or blue colored skin; headache; fast heartbeat; shortness of breath; feeling faint or lightheaded, falls; tiredness Side effects that usually do not require medical attention (report to your doctor or health care professional if they continue or are bothersome):  anxious  back pain  changes in taste  changes in vision  constipation  dizziness  fever  nausea, vomiting This list may not describe all possible side effects. Call your doctor for medical advice about side effects. You may report side effects to FDA at 1-800-FDA-1088. Where should I keep my medicine? This drug is given in a hospital or clinic and will not be stored at home. NOTE: This sheet is a summary. It may not cover all possible information. If you have questions about this medicine, talk to your doctor, pharmacist, or health care provider.  2021 [employer]/Gold Standard (2019-05-24 12:24:57)

## 2020-06-13 NOTE — Transfer of Care (Signed)
Immediate Anesthesia Transfer of Care Note  Patient: Mitchell Black  Procedure(s) Performed: LEFT L4-5 MICRODISCECTOMY (Left )  Patient Location: PACU  Anesthesia Type:General  Level of Consciousness: drowsy and patient cooperative  Airway & Oxygen Therapy: Patient Spontanous Breathing and Patient connected to face mask oxygen  Post-op Assessment: Report given to RN and Post -op Vital signs reviewed and stable  Post vital signs: Reviewed and stable  Last Vitals:  Vitals Value Taken Time  BP 105/67 06/13/20 0903  Temp 36.2 C 06/13/20 0903  Pulse 77 06/13/20 0912  Resp 23 06/13/20 0912  SpO2 97 % 06/13/20 0912  Vitals shown include unvalidated device data.  Last Pain:  Vitals:   06/13/20 0903  TempSrc:   PainSc: Asleep         Complications: No complications documented.

## 2020-06-13 NOTE — Anesthesia Procedure Notes (Signed)
Procedure Name: Intubation Date/Time: 06/13/2020 7:33 AM Performed by: Jonna Clark, CRNA Pre-anesthesia Checklist: Patient identified, Patient being monitored, Timeout performed, Emergency Drugs available and Suction available Patient Re-evaluated:Patient Re-evaluated prior to induction Oxygen Delivery Method: Circle system utilized Preoxygenation: Pre-oxygenation with 100% oxygen Induction Type: IV induction and Cricoid Pressure applied Ventilation: Mask ventilation without difficulty Laryngoscope Size: Mac and 4 Grade View: Grade II Tube type: Oral Tube size: 7.5 mm Number of attempts: 2 Airway Equipment and Method: Stylet Placement Confirmation: ETT inserted through vocal cords under direct vision,  positive ETCO2 and breath sounds checked- equal and bilateral Secured at: 22 cm Tube secured with: Tape Dental Injury: Teeth and Oropharynx as per pre-operative assessment  Future Recommendations: Recommend- induction with short-acting agent, and alternative techniques readily available

## 2020-06-14 ENCOUNTER — Encounter: Payer: Self-pay | Admitting: Neurosurgery

## 2022-02-22 IMAGING — RF DG C-ARM 1-60 MIN
1 series · 1 of 1 positions shown · non-contrast
Comparison: None.

CLINICAL DATA: L4-5 micro discectomy, intraoperative examination

EXAM:
LUMBAR SPINE - 2-3 VIEW; DG C-ARM 1-60 MIN

[Series 1: dg x-ray · 0.20mm/px · 1 of 1 slices shown]
[im 1/1]
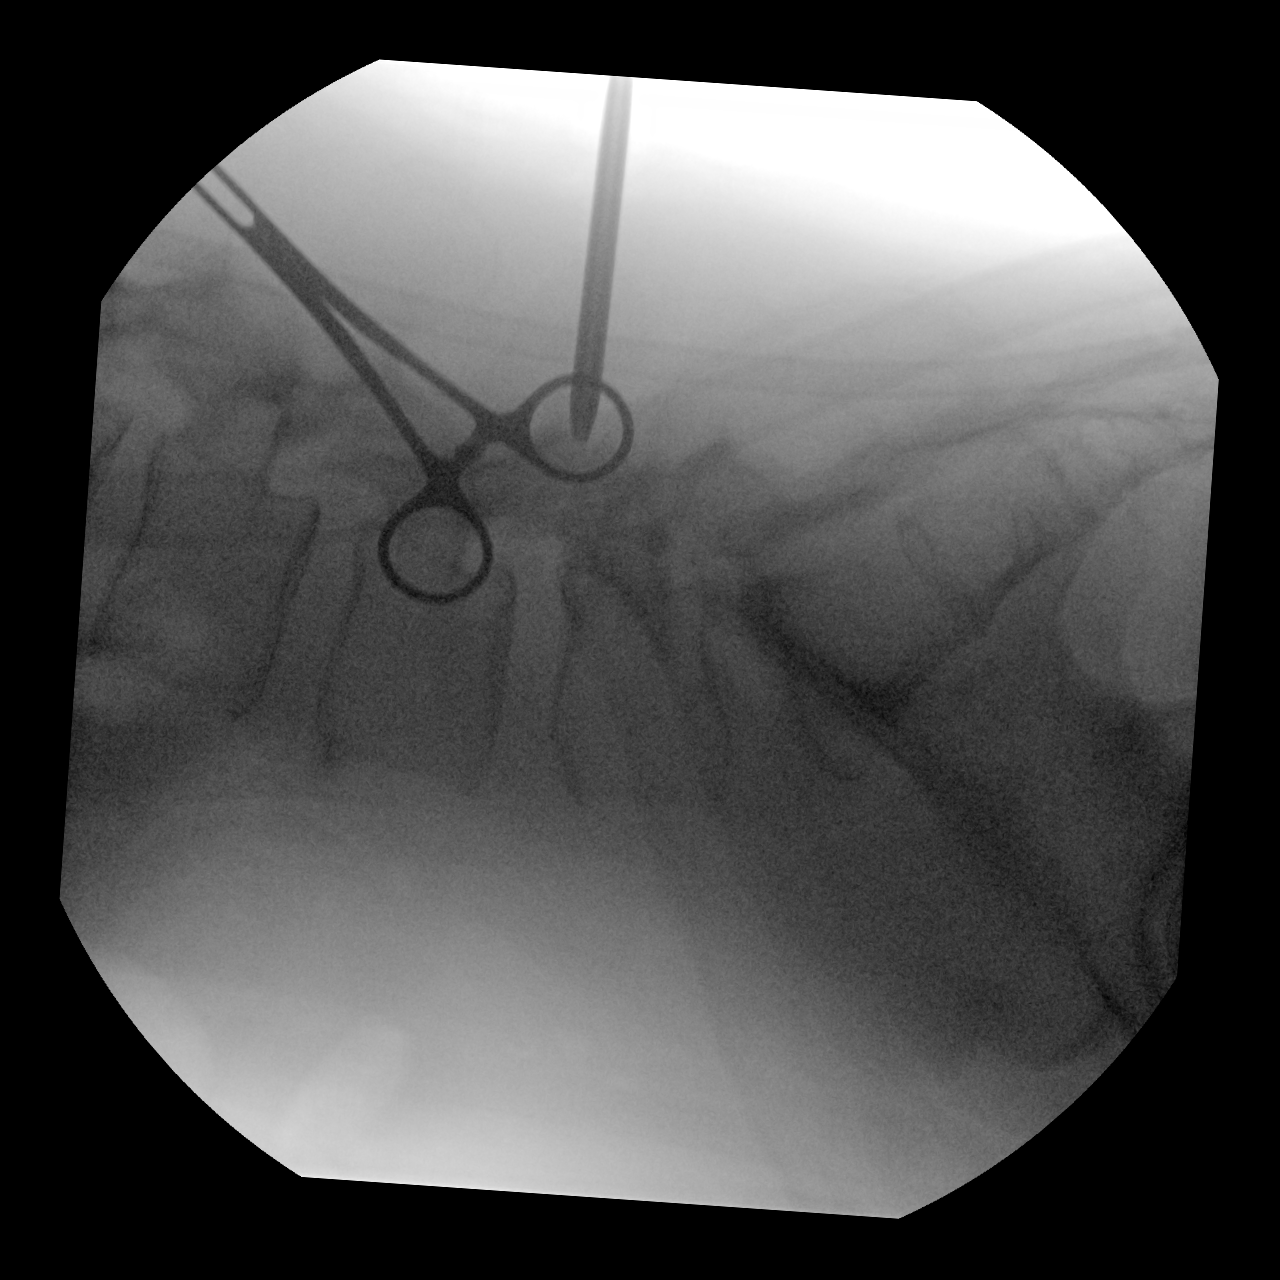

[1 of 1 positions shown; findings below may reference images not displayed]

FINDINGS: Cross-table portable radiograph of the lumbar spine demonstrates
lumbar spine from L3 through S2. A metallic probe overlies the
posterior aspect of the L4-5 facet. Hemostats overlie the pedicle of
L4 and the L4-5 facet.

FLUOROSCOPY TIME:  Time: 2.4 seconds

Images: 1

Dose: 2.0311 mGy
IMPRESSION: Metallic probe overlies the posterior aspect of the a L4-5 facet.
